# Patient Record
Sex: Female | Born: 1945 | Race: Black or African American | Hispanic: No | Marital: Married | Smoking: Never smoker
Health system: Southern US, Community
[De-identification: ages and names within clinical notes are randomized; demographics above are authoritative.]

## PROBLEM LIST (undated history)

## (undated) DIAGNOSIS — Z972 Presence of dental prosthetic device (complete) (partial): Secondary | ICD-10-CM

## (undated) DIAGNOSIS — E785 Hyperlipidemia, unspecified: Secondary | ICD-10-CM

## (undated) DIAGNOSIS — E119 Type 2 diabetes mellitus without complications: Secondary | ICD-10-CM

## (undated) DIAGNOSIS — I10 Essential (primary) hypertension: Secondary | ICD-10-CM

---

## 1986-12-31 HISTORY — PX: VAGINAL HYSTERECTOMY: SUR661

## 2005-03-15 ENCOUNTER — Ambulatory Visit: Payer: Self-pay | Admitting: Family Medicine

## 2005-10-19 ENCOUNTER — Ambulatory Visit: Payer: Self-pay | Admitting: Family Medicine

## 2007-06-11 ENCOUNTER — Ambulatory Visit: Payer: Self-pay | Admitting: Gastroenterology

## 2008-12-06 ENCOUNTER — Ambulatory Visit: Payer: Self-pay | Admitting: Family Medicine

## 2009-05-16 ENCOUNTER — Ambulatory Visit: Payer: Self-pay | Admitting: Family Medicine

## 2009-06-13 ENCOUNTER — Ambulatory Visit: Payer: Self-pay | Admitting: Internal Medicine

## 2010-07-20 ENCOUNTER — Ambulatory Visit: Payer: Self-pay | Admitting: Family Medicine

## 2012-01-16 ENCOUNTER — Ambulatory Visit: Payer: Self-pay | Admitting: Family Medicine

## 2012-01-17 ENCOUNTER — Ambulatory Visit: Payer: Self-pay | Admitting: Family Medicine

## 2012-02-06 ENCOUNTER — Ambulatory Visit: Payer: Self-pay | Admitting: General Surgery

## 2012-07-28 ENCOUNTER — Ambulatory Visit: Payer: Self-pay | Admitting: General Surgery

## 2013-07-23 ENCOUNTER — Ambulatory Visit: Payer: Self-pay | Admitting: Family Medicine

## 2013-07-27 ENCOUNTER — Ambulatory Visit: Payer: Self-pay | Admitting: Family Medicine

## 2014-01-29 ENCOUNTER — Ambulatory Visit: Payer: Self-pay | Admitting: Unknown Physician Specialty

## 2014-08-09 ENCOUNTER — Ambulatory Visit: Payer: Self-pay | Admitting: Family Medicine

## 2015-08-31 ENCOUNTER — Other Ambulatory Visit: Payer: Self-pay | Admitting: Family Medicine

## 2015-08-31 DIAGNOSIS — Z780 Asymptomatic menopausal state: Secondary | ICD-10-CM

## 2015-08-31 DIAGNOSIS — Z1231 Encounter for screening mammogram for malignant neoplasm of breast: Secondary | ICD-10-CM

## 2015-10-19 ENCOUNTER — Other Ambulatory Visit: Payer: Self-pay | Admitting: Family Medicine

## 2015-10-19 ENCOUNTER — Ambulatory Visit
Admission: RE | Admit: 2015-10-19 | Discharge: 2015-10-19 | Disposition: A | Discharge status: Home / Self Care | Payer: Medicare Other | Source: Ambulatory Visit | Attending: Family Medicine | Admitting: Family Medicine

## 2015-10-19 DIAGNOSIS — Z780 Asymptomatic menopausal state: Secondary | ICD-10-CM | POA: Diagnosis not present

## 2015-10-19 DIAGNOSIS — Z1231 Encounter for screening mammogram for malignant neoplasm of breast: Secondary | ICD-10-CM

## 2015-11-16 ENCOUNTER — Ambulatory Visit
Admission: RE | Admit: 2015-11-16 | Discharge: 2015-11-16 | Disposition: A | Discharge status: Home / Self Care | Payer: Medicare Other | Source: Ambulatory Visit | Attending: Family Medicine | Admitting: Family Medicine

## 2015-11-16 DIAGNOSIS — Z1231 Encounter for screening mammogram for malignant neoplasm of breast: Secondary | ICD-10-CM | POA: Insufficient documentation

## 2017-04-19 ENCOUNTER — Other Ambulatory Visit: Payer: Self-pay | Admitting: Family Medicine

## 2017-04-19 DIAGNOSIS — Z1231 Encounter for screening mammogram for malignant neoplasm of breast: Secondary | ICD-10-CM

## 2017-04-25 ENCOUNTER — Ambulatory Visit
Admission: RE | Admit: 2017-04-25 | Discharge: 2017-04-25 | Disposition: A | Discharge status: Home / Self Care | Payer: Medicare Other | Source: Ambulatory Visit | Attending: Family Medicine | Admitting: Family Medicine

## 2017-04-25 DIAGNOSIS — Z1231 Encounter for screening mammogram for malignant neoplasm of breast: Secondary | ICD-10-CM | POA: Diagnosis not present

## 2017-06-18 ENCOUNTER — Telehealth: Payer: Self-pay

## 2017-06-18 ENCOUNTER — Other Ambulatory Visit: Payer: Self-pay

## 2017-06-18 DIAGNOSIS — Z1211 Encounter for screening for malignant neoplasm of colon: Secondary | ICD-10-CM

## 2017-06-18 NOTE — Telephone Encounter (Signed)
Gastroenterology Pre-Procedure Review  Request Date: 07/15/17 Requesting Physician: Dr. Allen Norris  PATIENT REVIEW QUESTIONS: The patient responded to the following health history questions as indicated:    1. Are you having any GI issues? no 2. Do you have a personal history of Polyps? no 3. Do you have a family history of Colon Cancer or Polyps? no 4. Diabetes Mellitus? yes (Type 2) 5. Joint replacements in the past 12 months?no 6. Major health problems in the past 3 months?no 7. Any artificial heart valves, MVP, or defibrillator?no    MEDICATIONS & ALLERGIES:    Patient reports the following regarding taking any anticoagulation/antiplatelet therapy:   Plavix, Coumadin, Eliquis, Xarelto, Lovenox, Pradaxa, Brilinta, or Effient? no Aspirin? no  Patient confirms/reports the following medications:  No current outpatient prescriptions on file.   No current facility-administered medications for this visit.     Patient confirms/reports the following allergies:  Allergies not on file  No orders of the defined types were placed in this encounter.   AUTHORIZATION INFORMATION Primary Insurance: 1D#: Group #:  Secondary Insurance: 1D#: Group #:  SCHEDULE INFORMATION: Date: 07/15/17 Time: Location:[facility]

## 2017-07-09 ENCOUNTER — Encounter: Payer: Self-pay | Admitting: *Deleted

## 2017-07-15 ENCOUNTER — Ambulatory Visit
Admission: RE | Admit: 2017-07-15 | Discharge: 2017-07-15 | Disposition: A | Discharge status: Home / Self Care | Payer: Medicare Other | Source: Ambulatory Visit | Attending: Gastroenterology | Admitting: Gastroenterology

## 2017-07-15 ENCOUNTER — Encounter
Admission: RE | Disposition: A | Discharge status: Home / Self Care | Payer: Self-pay | Source: Ambulatory Visit | Attending: Gastroenterology

## 2017-07-15 ENCOUNTER — Ambulatory Visit: Payer: Medicare Other | Admitting: Anesthesiology

## 2017-07-15 DIAGNOSIS — Z803 Family history of malignant neoplasm of breast: Secondary | ICD-10-CM | POA: Insufficient documentation

## 2017-07-15 DIAGNOSIS — E119 Type 2 diabetes mellitus without complications: Secondary | ICD-10-CM | POA: Diagnosis not present

## 2017-07-15 DIAGNOSIS — Z1211 Encounter for screening for malignant neoplasm of colon: Secondary | ICD-10-CM

## 2017-07-15 DIAGNOSIS — Z794 Long term (current) use of insulin: Secondary | ICD-10-CM | POA: Insufficient documentation

## 2017-07-15 DIAGNOSIS — I10 Essential (primary) hypertension: Secondary | ICD-10-CM | POA: Insufficient documentation

## 2017-07-15 DIAGNOSIS — Z90710 Acquired absence of both cervix and uterus: Secondary | ICD-10-CM | POA: Diagnosis not present

## 2017-07-15 DIAGNOSIS — K648 Other hemorrhoids: Secondary | ICD-10-CM | POA: Insufficient documentation

## 2017-07-15 DIAGNOSIS — D123 Benign neoplasm of transverse colon: Secondary | ICD-10-CM | POA: Diagnosis not present

## 2017-07-15 DIAGNOSIS — E785 Hyperlipidemia, unspecified: Secondary | ICD-10-CM | POA: Insufficient documentation

## 2017-07-15 DIAGNOSIS — Z79899 Other long term (current) drug therapy: Secondary | ICD-10-CM | POA: Insufficient documentation

## 2017-07-15 HISTORY — PX: COLONOSCOPY WITH PROPOFOL: SHX5780

## 2017-07-15 HISTORY — DX: Type 2 diabetes mellitus without complications: E11.9

## 2017-07-15 HISTORY — DX: Essential (primary) hypertension: I10

## 2017-07-15 HISTORY — DX: Presence of dental prosthetic device (complete) (partial): Z97.2

## 2017-07-15 HISTORY — DX: Hyperlipidemia, unspecified: E78.5

## 2017-07-15 HISTORY — PX: POLYPECTOMY: SHX5525

## 2017-07-15 LAB — GLUCOSE, CAPILLARY
Glucose-Capillary: 183 mg/dL — ABNORMAL HIGH (ref 65–99)
Glucose-Capillary: 196 mg/dL — ABNORMAL HIGH (ref 65–99)

## 2017-07-15 SURGERY — COLONOSCOPY WITH PROPOFOL
Anesthesia: General

## 2017-07-15 MED ORDER — STERILE WATER FOR IRRIGATION IR SOLN
Status: DC | PRN
  Administered 2017-07-15: 09:00:00

## 2017-07-15 MED ORDER — LACTATED RINGERS IV SOLN
INTRAVENOUS | Status: DC
  Administered 2017-07-15: 08:00:00 via INTRAVENOUS

## 2017-07-15 MED ORDER — ACETAMINOPHEN 160 MG/5ML PO SOLN: 325.0000 mg | ORAL | Status: DC | PRN

## 2017-07-15 MED ORDER — ACETAMINOPHEN 325 MG PO TABS: 325.0000 mg | ORAL_TABLET | ORAL | Status: DC | PRN

## 2017-07-15 MED ORDER — PROPOFOL 10 MG/ML IV BOLUS
INTRAVENOUS | Status: DC | PRN
  Administered 2017-07-15 (×2): 50 mg via INTRAVENOUS
  Administered 2017-07-15: 40 mg via INTRAVENOUS
  Administered 2017-07-15: 20 mg via INTRAVENOUS

## 2017-07-15 SURGICAL SUPPLY — 23 items

## 2017-07-15 NOTE — Anesthesia Postprocedure Evaluation (Signed)
Anesthesia Post Note  Patient: YASMENE SALOMONE  Procedure(s) Performed: Procedure(s) (LRB): COLONOSCOPY WITH PROPOFOL (N/A) POLYPECTOMY (N/A)  Patient location during evaluation: PACU Anesthesia Type: General Level of consciousness: awake and alert and oriented Pain management: satisfactory to patient Vital Signs Assessment: post-procedure vital signs reviewed and stable Respiratory status: spontaneous breathing, nonlabored ventilation and respiratory function stable Cardiovascular status: blood pressure returned to baseline and stable Postop Assessment: Adequate PO intake and No signs of nausea or vomiting Anesthetic complications: no    Raliegh Ip

## 2017-07-15 NOTE — Transfer of Care (Signed)
Immediate Anesthesia Transfer of Care Note  Patient: April Nichols  Procedure(s) Performed: Procedure(s) with comments: COLONOSCOPY WITH PROPOFOL (N/A) - Diabetic - insulin POLYPECTOMY (N/A)  Patient Location: PACU  Anesthesia Type: General  Level of Consciousness: awake, alert  and patient cooperative  Airway and Oxygen Therapy: Patient Spontanous Breathing and Patient connected to supplemental oxygen  Post-op Assessment: Post-op Vital signs reviewed, Patient's Cardiovascular Status Stable, Respiratory Function Stable, Patent Airway and No signs of Nausea or vomiting  Post-op Vital Signs: Reviewed and stable  Complications: No apparent anesthesia complications

## 2017-07-15 NOTE — Anesthesia Preprocedure Evaluation (Signed)
Anesthesia Evaluation  Patient identified by MRN, date of birth, ID band Patient awake    Reviewed: Allergy & Precautions, H&P , NPO status , Patient's Chart, lab work & pertinent test results  Airway Mallampati: III  TM Distance: >3 FB Neck ROM: full    Dental no notable dental hx.    Pulmonary    Pulmonary exam normal breath sounds clear to auscultation       Cardiovascular hypertension, Normal cardiovascular exam Rhythm:regular Rate:Normal     Neuro/Psych    GI/Hepatic   Endo/Other  diabetes  Renal/GU      Musculoskeletal   Abdominal   Peds  Hematology   Anesthesia Other Findings   Reproductive/Obstetrics                             Anesthesia Physical Anesthesia Plan  ASA: II  Anesthesia Plan: General   Post-op Pain Management:    Induction:   PONV Risk Score and Plan: 3 and Propofol  Airway Management Planned:   Additional Equipment:   Intra-op Plan:   Post-operative Plan:   Informed Consent: I have reviewed the patients History and Physical, chart, labs and discussed the procedure including the risks, benefits and alternatives for the proposed anesthesia with the patient or authorized representative who has indicated his/her understanding and acceptance.     Plan Discussed with: CRNA  Anesthesia Plan Comments:         Anesthesia Quick Evaluation

## 2017-07-15 NOTE — Discharge Instructions (Signed)
General Anesthesia, Adult, Care After °These instructions provide you with information about caring for yourself after your procedure. Your health care provider may also give you more specific instructions. Your treatment has been planned according to current medical practices, but problems sometimes occur. Call your health care provider if you have any problems or questions after your procedure. °What can I expect after the procedure? °After the procedure, it is common to have: °· Vomiting. °· A sore throat. °· Mental slowness. ° °It is common to feel: °· Nauseous. °· Cold or shivery. °· Sleepy. °· Tired. °· Sore or achy, even in parts of your body where you did not have surgery. ° °Follow these instructions at home: °For at least 24 hours after the procedure: °· Do not: °? Participate in activities where you could fall or become injured. °? Drive. °? Use heavy machinery. °? Drink alcohol. °? Take sleeping pills or medicines that cause drowsiness. °? Make important decisions or sign legal documents. °? Take care of children on your own. °· Rest. °Eating and drinking °· If you vomit, drink water, juice, or soup when you can drink without vomiting. °· Drink enough fluid to keep your urine clear or pale yellow. °· Make sure you have little or no nausea before eating solid foods. °· Follow the diet recommended by your health care provider. °General instructions °· Have a responsible adult stay with you until you are awake and alert. °· Return to your normal activities as told by your health care provider. Ask your health care provider what activities are safe for you. °· Take over-the-counter and prescription medicines only as told by your health care provider. °· If you smoke, do not smoke without supervision. °· Keep all follow-up visits as told by your health care provider. This is important. °Contact a health care provider if: °· You continue to have nausea or vomiting at home, and medicines are not helpful. °· You  cannot drink fluids or start eating again. °· You cannot urinate after 8-12 hours. °· You develop a skin rash. °· You have fever. °· You have increasing redness at the site of your procedure. °Get help right away if: °· You have difficulty breathing. °· You have chest pain. °· You have unexpected bleeding. °· You feel that you are having a life-threatening or urgent problem. °This information is not intended to replace advice given to you by your health care provider. Make sure you discuss any questions you have with your health care provider. °Document Released: 03/25/2001 Document Revised: 05/21/2016 Document Reviewed: 12/01/2015 °Elsevier Interactive Patient Education © 2018 [employer] ° °

## 2017-07-15 NOTE — H&P (Signed)
Lucilla Lame, MD [facility] [address] [place], [place] [address] Phone: [phone] Fax : [phone]  Primary Care Physician:  Ileana Roup, MD Primary Gastroenterologist:  Dr. Allen Norris  Pre-Procedure History & Physical: HPI:  April Nichols is a 71 y.o. female is here for a screening colonoscopy.   Past Medical History:  Diagnosis Date  . Diabetes mellitus without complication ([place])   . Hyperlipidemia   . Hypertension   . Wears dentures    partial upper    Past Surgical History:  Procedure Laterality Date  . VAGINAL HYSTERECTOMY  1988   partial    Prior to Admission medications   Medication Sig Start Date End Date Taking? Authorizing Provider  aspirin 81 MG tablet Take 81 mg by mouth daily.   Yes [provider]  Biotin 5 MG TABS Take by mouth daily.   Yes [provider]  Calcium-Magnesium-Vitamin D (CALCIUM 1200+D3 PO) Take by mouth daily.   Yes [provider]  Coenzyme Q10 (COQ10) 200 MG CAPS Take by mouth daily.   Yes [provider]  empagliflozin (JARDIANCE) 25 MG TABS tablet Take 25 mg by mouth daily.   Yes [provider]  insulin glargine (LANTUS) 100 UNIT/ML injection Inject 52 Units into the skin at bedtime.   Yes [provider]  insulin lispro (HUMALOG) 100 UNIT/ML injection Inject into the skin 2 (two) times daily. 22 units AM, 36 units PM   Yes [provider]  losartan-hydrochlorothiazide (HYZAAR) 100-25 MG tablet Take 1 tablet by mouth daily.   Yes [provider]  lovastatin (MEVACOR) 40 MG tablet Take 40 mg by mouth at bedtime.   Yes [provider]  Lutein 20 MG TABS Take by mouth daily.   Yes [provider]  Multiple Vitamins-Minerals (WOMENS MULTIVITAMIN PO) Take by mouth daily.   Yes [provider]  omega-3 fish oil (MAXEPA) 1000 MG CAPS capsule Take 1 capsule by mouth daily.   Yes [provider]    Allergies as of 06/18/2017    . (Not on File)    Family History  Problem Relation Age of Onset  . Breast cancer Neg Hx     Social History   Social History  . Marital status: Married    Spouse name: N/A  . Number of children: N/A  . Years of education: N/A   Occupational History  . Not on file.   Social History Main Topics  . Smoking status: Never Smoker  . Smokeless tobacco: Never Used  . Alcohol use No  . Drug use: Unknown  . Sexual activity: Not on file   Other Topics Concern  . Not on file   Social History Narrative  . No narrative on file    Review of Systems: See HPI, otherwise negative ROS  Physical Exam: BP (!) 139/98   Pulse 72   Temp (!) 97.2 F (36.2 C) (Temporal)   Resp 16   Ht 5\' 1"  (1.549 m)   Wt 178 lb (80.7 kg)   SpO2 100%   BMI 33.63 kg/m  General:   Alert,  pleasant and cooperative in NAD Head:  Normocephalic and atraumatic. Neck:  Supple; no masses or thyromegaly. Lungs:  Clear throughout to auscultation.    Heart:  Regular rate and rhythm. Abdomen:  Soft, nontender and nondistended. Normal bowel sounds, without guarding, and without rebound.   Neurologic:  Alert and  oriented x4;  grossly normal neurologically.  Impression/Plan: April Nichols is  now here to undergo a screening colonoscopy.  Risks, benefits, and alternatives regarding colonoscopy have been reviewed with the patient.  Questions have been answered.  All parties agreeable.

## 2017-07-15 NOTE — Op Note (Signed)
[facility] Gastroenterology Patient Name: Albertha Beattie Procedure Date: 07/15/2017 8:47 AM MRN: [id number] Account #: [id number] Date of Birth: 1946-11-29 Admit Type: Outpatient Age: 71 Room: [facility] OR ROOM 01 Gender: Female Note Status: Finalized Procedure:            Colonoscopy Indications:          Screening for colorectal malignant neoplasm Providers:            Lucilla Lame MD, MD Referring MD:         Ileana Roup MD, MD (Referring MD) Medicines:            Propofol per Anesthesia Complications:        No immediate complications. Procedure:            Pre-Anesthesia Assessment:                       - Prior to the procedure, a History and Physical was                        performed, and patient medications and allergies were                        reviewed. The patient's tolerance of previous                        anesthesia was also reviewed. The risks and benefits of                        the procedure and the sedation options and risks were                        discussed with the patient. All questions were                        answered, and informed consent was obtained. Prior                        Anticoagulants: The patient has taken no previous                        anticoagulant or antiplatelet agents. ASA Grade                        Assessment: II - A patient with mild systemic disease.                        After reviewing the risks and benefits, the patient was                        deemed in satisfactory condition to undergo the                        procedure.                       After obtaining informed consent, the colonoscope was                        passed under direct vision. Throughout the procedure,  the patient's blood pressure, pulse, and oxygen                        saturations were monitored continuously. The Olympus                        Colonoscope [id number]) was introduced through the                         anus and advanced to the the cecum, identified by                        appendiceal orifice and ileocecal valve. The                        colonoscopy was performed without difficulty. The                        patient tolerated the procedure well. The quality of                        the bowel preparation was good. Findings:      The perianal and digital rectal examinations were normal.      Two sessile polyps were found in the hepatic flexure. The polyps were 2       to 3 mm in size. These polyps were removed with a cold snare. Resection       and retrieval were complete.      Non-bleeding internal hemorrhoids were found during retroflexion. The       hemorrhoids were Grade I (internal hemorrhoids that do not prolapse). Impression:           - Two 2 to 3 mm polyps at the hepatic flexure, removed                        with a cold snare. Resected and retrieved.                       - Non-bleeding internal hemorrhoids. Recommendation:       - Discharge patient to home.                       - Resume previous diet.                       - Continue present medications.                       - Await pathology results.                       - Repeat colonoscopy in 5 years if polyp adenoma and 10                        years if hyperplastic Procedure Code(s):    --- Professional ---                       [phone], Colonoscopy, flexible; with removal of tumor(s),                        polyp(s), or other lesion(s) by  snare technique Diagnosis Code(s):    --- Professional ---                       Z12.11, Encounter for screening for malignant neoplasm                        of colon                       D12.3, Benign neoplasm of transverse colon (hepatic                        flexure or splenic flexure) CPT copyright 2016 American Medical Association. All rights reserved. The codes documented in this report are preliminary and upon coder review may  be revised to  meet current compliance requirements. Lucilla Lame MD, MD 07/15/2017 9:13:29 AM This report has been signed electronically. Number of Addenda: 0 Note Initiated On: 07/15/2017 8:47 AM Scope Withdrawal Time: 0 hours 12 minutes 32 seconds  Total Procedure Duration: 0 hours 19 minutes 6 seconds       [facility]

## 2017-07-15 NOTE — Anesthesia Procedure Notes (Signed)
Procedure Name: General with mask airway Performed by: Melonie Florida A Pre-anesthesia Checklist: Patient being monitored, Patient identified, Emergency Drugs available, Suction available and Timeout performed Patient Re-evaluated:Patient Re-evaluated prior to induction Preoxygenation: Pre-oxygenation with 100% oxygen Induction Type: IV induction

## 2017-07-15 NOTE — Anesthesia Procedure Notes (Signed)
Performed by: Melonie Florida A Pre-anesthesia Checklist: Patient identified, Patient being monitored, Emergency Drugs available, Timeout performed and Suction available Patient Re-evaluated:Patient Re-evaluated prior to induction Oxygen Delivery Method: Circle system utilized Preoxygenation: Pre-oxygenation with 100% oxygen Induction Type: IV induction Ventilation: Mask ventilation without difficulty Placement Confirmation: positive ETCO2 Dental Injury: Teeth and Oropharynx as per pre-operative assessment

## 2017-07-16 ENCOUNTER — Encounter: Payer: Self-pay | Admitting: Gastroenterology

## 2017-07-17 ENCOUNTER — Encounter: Payer: Self-pay | Admitting: Gastroenterology

## 2017-07-18 ENCOUNTER — Encounter: Payer: Self-pay | Admitting: Gastroenterology

## 2018-06-19 ENCOUNTER — Other Ambulatory Visit: Payer: Self-pay | Admitting: Family Medicine

## 2018-06-19 DIAGNOSIS — Z1231 Encounter for screening mammogram for malignant neoplasm of breast: Secondary | ICD-10-CM

## 2018-07-02 ENCOUNTER — Ambulatory Visit
Admission: RE | Admit: 2018-07-02 | Discharge: 2018-07-02 | Disposition: A | Discharge status: Home / Self Care | Payer: Medicare Other | Source: Ambulatory Visit | Attending: Family Medicine | Admitting: Family Medicine

## 2018-07-02 DIAGNOSIS — Z1231 Encounter for screening mammogram for malignant neoplasm of breast: Secondary | ICD-10-CM | POA: Diagnosis present

## 2018-07-21 IMAGING — MG MM DIGITAL SCREENING BILAT W/ TOMO W/ CAD
8 series · 8 of 24 positions shown · non-contrast
Comparison: Previous exam(s).

CLINICAL DATA: Screening.

EXAM:
DIGITAL SCREENING BILATERAL MAMMOGRAM WITH TOMO AND CAD

[R MLO synth-2D]
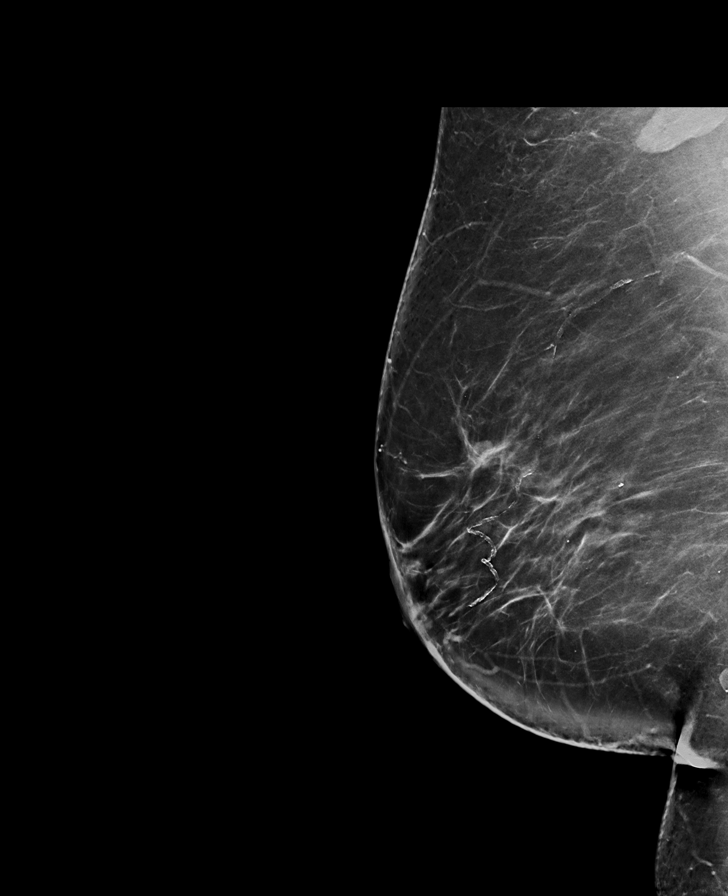

[L MLO synth-2D]
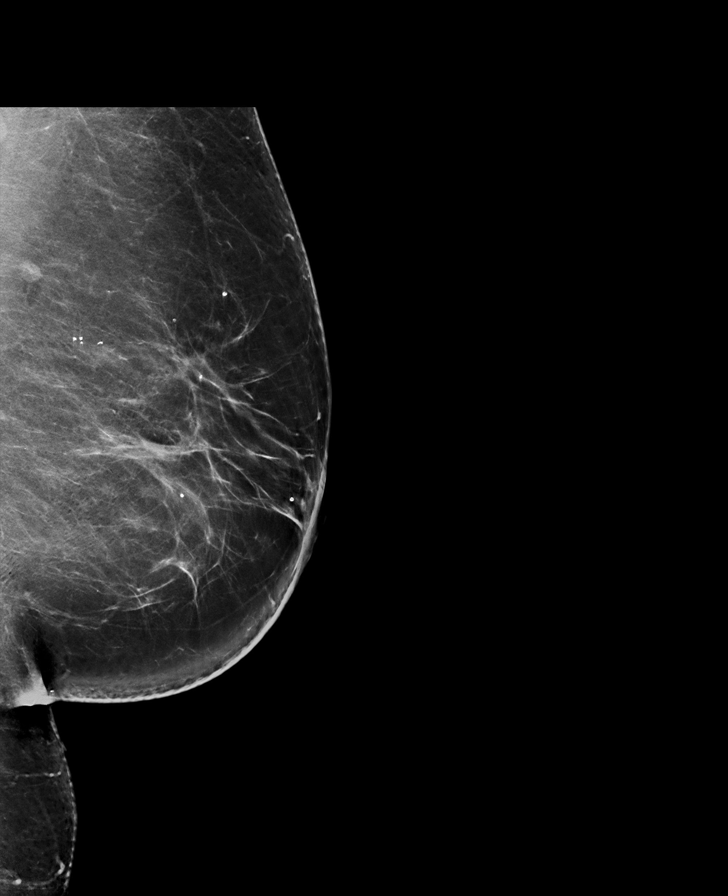

[R CC synth-2D]
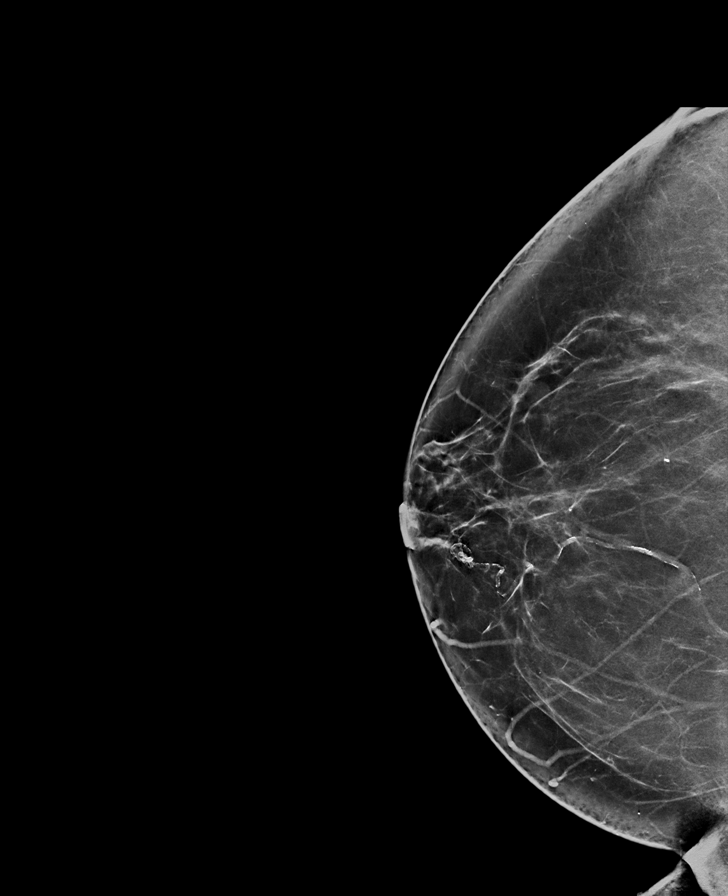

[L CC synth-2D]
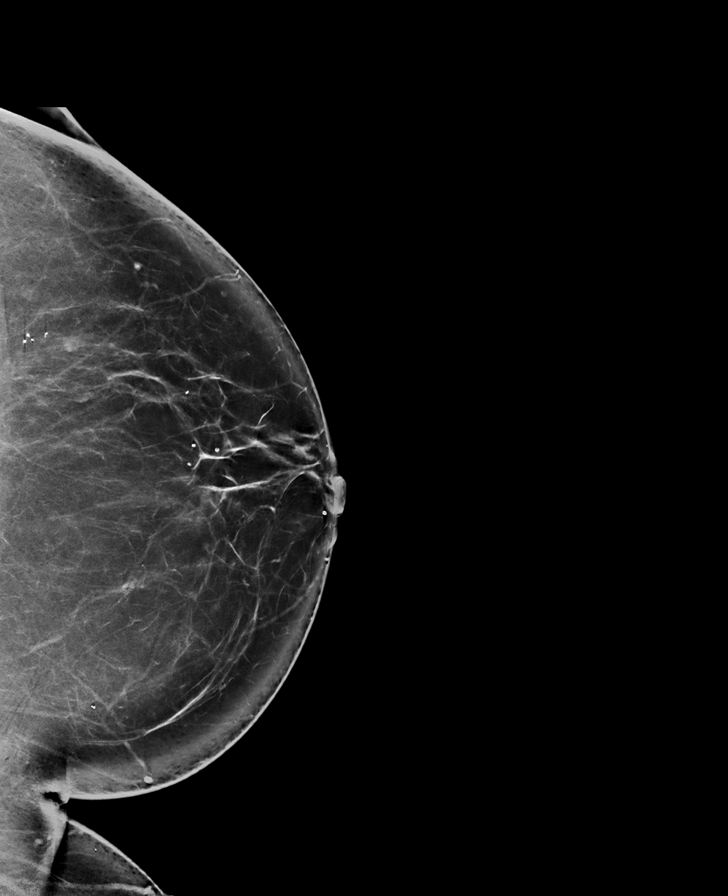

[L MLO tomo · tomo slice 48/95.0]
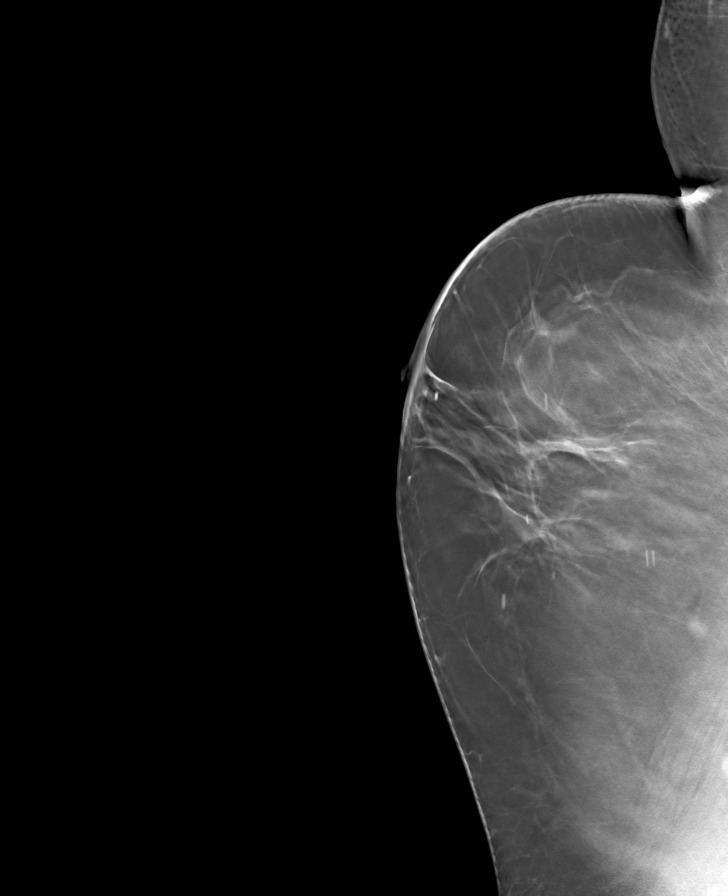

[R MLO tomo · tomo slice 48/95.0]
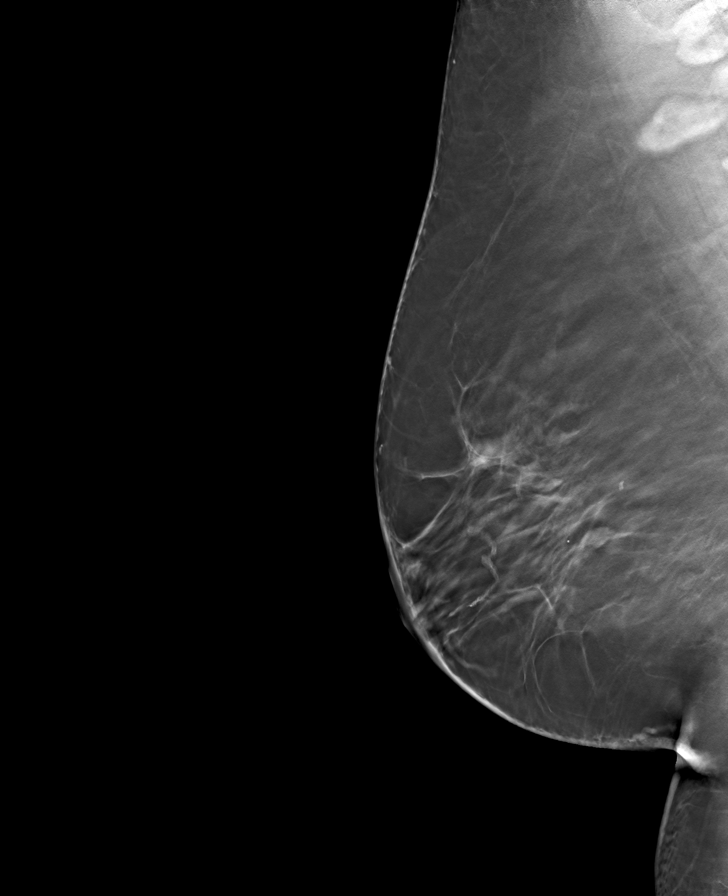

[L CC tomo · tomo slice 47/93.0]
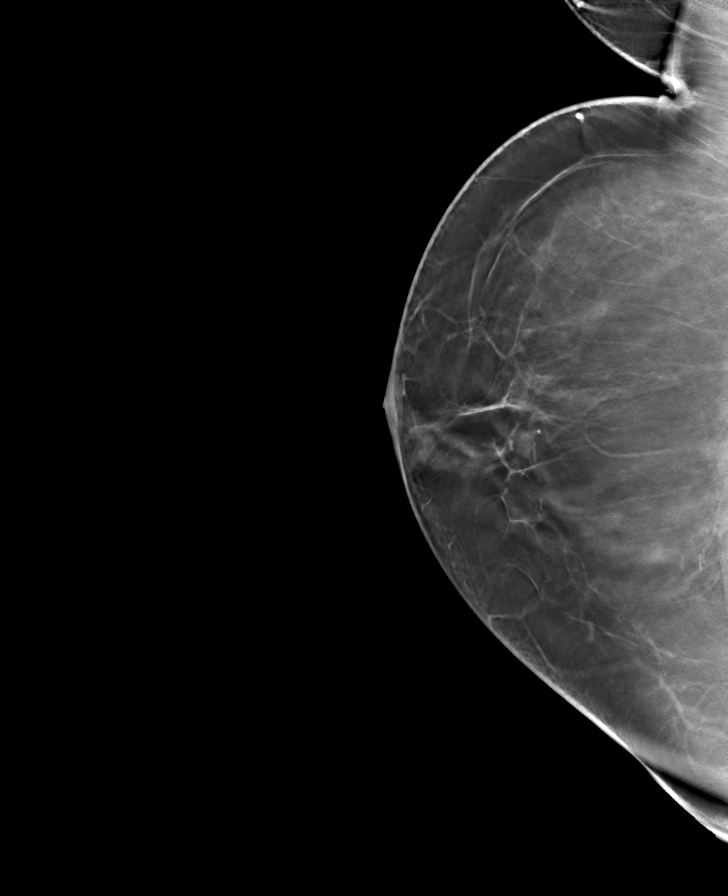

[R CC tomo · tomo slice 45/88.0]
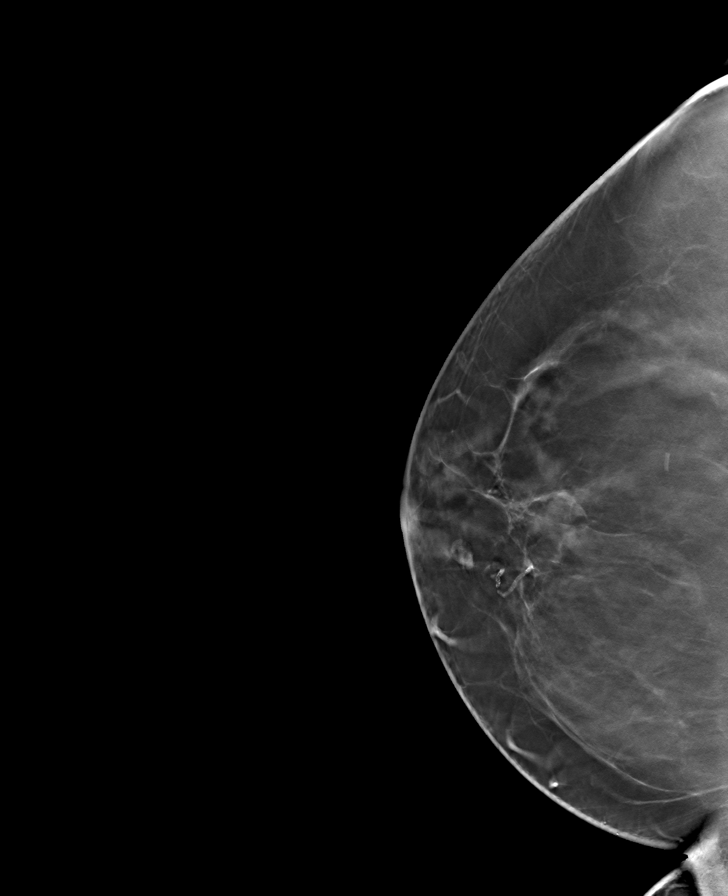

[8 of 24 positions shown; findings below may reference images not displayed]

ACR Breast Density Category b: There are scattered areas of
fibroglandular density.
FINDINGS: There are no findings suspicious for malignancy. Images were
processed with CAD.
IMPRESSION: No mammographic evidence of malignancy. A result letter of this
screening mammogram will be mailed directly to the patient.

RECOMMENDATION:
Screening mammogram in one year. (Code:[id number]-775)

BI-RADS CATEGORY  1: Negative.

## 2019-07-20 ENCOUNTER — Other Ambulatory Visit: Payer: Self-pay | Admitting: Family Medicine

## 2019-07-20 DIAGNOSIS — Z1231 Encounter for screening mammogram for malignant neoplasm of breast: Secondary | ICD-10-CM

## 2019-07-23 ENCOUNTER — Ambulatory Visit
Admission: RE | Admit: 2019-07-23 | Discharge: 2019-07-23 | Disposition: A | Discharge status: Home / Self Care | Payer: Medicare Other | Source: Ambulatory Visit | Attending: Family Medicine | Admitting: Family Medicine

## 2019-07-23 ENCOUNTER — Other Ambulatory Visit: Payer: Self-pay

## 2019-07-23 DIAGNOSIS — Z1231 Encounter for screening mammogram for malignant neoplasm of breast: Secondary | ICD-10-CM | POA: Diagnosis not present

## 2020-08-29 ENCOUNTER — Other Ambulatory Visit: Payer: Self-pay | Admitting: Family Medicine

## 2020-08-29 DIAGNOSIS — Z1231 Encounter for screening mammogram for malignant neoplasm of breast: Secondary | ICD-10-CM

## 2020-09-13 ENCOUNTER — Ambulatory Visit
Admission: RE | Admit: 2020-09-13 | Discharge: 2020-09-13 | Disposition: A | Discharge status: Home / Self Care | Payer: Medicare PPO | Source: Ambulatory Visit | Attending: Family Medicine | Admitting: Family Medicine

## 2020-09-13 ENCOUNTER — Other Ambulatory Visit: Payer: Self-pay

## 2020-09-13 DIAGNOSIS — Z1231 Encounter for screening mammogram for malignant neoplasm of breast: Secondary | ICD-10-CM | POA: Insufficient documentation

## 2020-09-20 ENCOUNTER — Other Ambulatory Visit: Payer: Self-pay | Admitting: Family Medicine

## 2020-09-20 DIAGNOSIS — N6310 Unspecified lump in the right breast, unspecified quadrant: Secondary | ICD-10-CM

## 2020-09-20 DIAGNOSIS — R928 Other abnormal and inconclusive findings on diagnostic imaging of breast: Secondary | ICD-10-CM

## 2020-09-28 ENCOUNTER — Ambulatory Visit
Admission: RE | Admit: 2020-09-28 | Discharge: 2020-09-28 | Disposition: A | Discharge status: Home / Self Care | Payer: Medicare PPO | Source: Ambulatory Visit | Attending: Family Medicine | Admitting: Family Medicine

## 2020-09-28 DIAGNOSIS — N6310 Unspecified lump in the right breast, unspecified quadrant: Secondary | ICD-10-CM | POA: Diagnosis present

## 2020-09-28 DIAGNOSIS — R928 Other abnormal and inconclusive findings on diagnostic imaging of breast: Secondary | ICD-10-CM | POA: Insufficient documentation

## 2021-08-16 ENCOUNTER — Other Ambulatory Visit: Payer: Self-pay | Admitting: Family Medicine

## 2021-08-16 DIAGNOSIS — Z1231 Encounter for screening mammogram for malignant neoplasm of breast: Secondary | ICD-10-CM

## 2021-10-18 ENCOUNTER — Ambulatory Visit
Admission: RE | Admit: 2021-10-18 | Discharge: 2021-10-18 | Disposition: A | Discharge status: Home / Self Care | Payer: Medicare PPO | Source: Ambulatory Visit | Attending: Family Medicine | Admitting: Family Medicine

## 2021-10-18 ENCOUNTER — Other Ambulatory Visit: Payer: Self-pay

## 2021-10-18 DIAGNOSIS — Z1231 Encounter for screening mammogram for malignant neoplasm of breast: Secondary | ICD-10-CM | POA: Insufficient documentation

## 2022-10-08 ENCOUNTER — Other Ambulatory Visit: Payer: Self-pay | Admitting: Family Medicine

## 2022-10-08 DIAGNOSIS — Z1231 Encounter for screening mammogram for malignant neoplasm of breast: Secondary | ICD-10-CM

## 2022-10-24 ENCOUNTER — Ambulatory Visit
Admission: RE | Admit: 2022-10-24 | Discharge: 2022-10-24 | Disposition: A | Discharge status: Home / Self Care | Payer: Medicare PPO | Source: Ambulatory Visit | Attending: Family Medicine | Admitting: Family Medicine

## 2022-10-24 DIAGNOSIS — Z1231 Encounter for screening mammogram for malignant neoplasm of breast: Secondary | ICD-10-CM | POA: Insufficient documentation

## 2023-11-19 ENCOUNTER — Other Ambulatory Visit: Payer: Self-pay | Admitting: Family Medicine

## 2023-11-19 DIAGNOSIS — Z1231 Encounter for screening mammogram for malignant neoplasm of breast: Secondary | ICD-10-CM

## 2023-12-11 ENCOUNTER — Ambulatory Visit
Admission: RE | Admit: 2023-12-11 | Discharge: 2023-12-11 | Disposition: A | Discharge status: Home / Self Care | Payer: Medicare PPO | Source: Ambulatory Visit | Attending: Family Medicine | Admitting: Family Medicine

## 2023-12-11 DIAGNOSIS — Z1231 Encounter for screening mammogram for malignant neoplasm of breast: Secondary | ICD-10-CM | POA: Diagnosis present

## 2023-12-18 ENCOUNTER — Other Ambulatory Visit: Payer: Self-pay | Admitting: Family Medicine

## 2023-12-18 DIAGNOSIS — R928 Other abnormal and inconclusive findings on diagnostic imaging of breast: Secondary | ICD-10-CM

## 2023-12-23 ENCOUNTER — Inpatient Hospital Stay: Admission: RE | Admit: 2023-12-23 | Payer: Medicare PPO | Source: Ambulatory Visit

## 2023-12-23 ENCOUNTER — Other Ambulatory Visit: Payer: Medicare PPO

## 2024-01-15 ENCOUNTER — Ambulatory Visit
Admission: RE | Admit: 2024-01-15 | Discharge: 2024-01-15 | Disposition: A | Discharge status: Home / Self Care | Payer: Medicare PPO | Source: Ambulatory Visit | Attending: Family Medicine | Admitting: Family Medicine

## 2024-01-15 DIAGNOSIS — R928 Other abnormal and inconclusive findings on diagnostic imaging of breast: Secondary | ICD-10-CM

## 2024-01-16 ENCOUNTER — Other Ambulatory Visit: Payer: Self-pay | Admitting: Family Medicine

## 2024-01-16 DIAGNOSIS — R928 Other abnormal and inconclusive findings on diagnostic imaging of breast: Secondary | ICD-10-CM

## 2024-01-16 DIAGNOSIS — N63 Unspecified lump in unspecified breast: Secondary | ICD-10-CM

## 2024-01-21 ENCOUNTER — Ambulatory Visit
Admission: RE | Admit: 2024-01-21 | Discharge: 2024-01-21 | Disposition: A | Discharge status: Home / Self Care | Payer: Medicare PPO | Source: Ambulatory Visit | Attending: Family Medicine | Admitting: Family Medicine

## 2024-01-21 DIAGNOSIS — N62 Hypertrophy of breast: Secondary | ICD-10-CM | POA: Insufficient documentation

## 2024-01-21 DIAGNOSIS — R928 Other abnormal and inconclusive findings on diagnostic imaging of breast: Secondary | ICD-10-CM | POA: Insufficient documentation

## 2024-01-21 DIAGNOSIS — N63 Unspecified lump in unspecified breast: Secondary | ICD-10-CM

## 2024-01-21 HISTORY — PX: BREAST BIOPSY: SHX20

## 2024-01-21 MED ORDER — LIDOCAINE-EPINEPHRINE 1 %-1:100000 IJ SOLN
10.0000 mL | Freq: Once | INTRAMUSCULAR | Status: AC
  Administered 2024-01-21: 10 mL
  Filled 2024-01-21: qty 10

## 2024-01-21 MED ORDER — LIDOCAINE 1 % OPTIME INJ - NO CHARGE
5.0000 mL | Freq: Once | INTRAMUSCULAR | Status: AC
  Administered 2024-01-21: 5 mL
  Filled 2024-01-21: qty 6

## 2024-01-22 LAB — SURGICAL PATHOLOGY

## 2024-11-20 ENCOUNTER — Other Ambulatory Visit: Payer: Self-pay | Admitting: Internal Medicine

## 2024-11-20 DIAGNOSIS — Z1231 Encounter for screening mammogram for malignant neoplasm of breast: Secondary | ICD-10-CM

## 2025-01-05 ENCOUNTER — Ambulatory Visit
Admission: RE | Admit: 2025-01-05 | Discharge: 2025-01-05 | Disposition: A | Discharge status: Home / Self Care | Source: Ambulatory Visit | Attending: Internal Medicine | Admitting: Internal Medicine

## 2025-01-05 DIAGNOSIS — Z1231 Encounter for screening mammogram for malignant neoplasm of breast: Secondary | ICD-10-CM | POA: Insufficient documentation
